# Patient Record
Sex: Male | Born: 1995 | Race: White | Hispanic: No | Marital: Single | Smoking: Never smoker
Health system: Southern US, Community
[De-identification: ages and names within clinical notes are randomized; demographics above are authoritative.]

## PROBLEM LIST (undated history)

## (undated) DIAGNOSIS — S060XAA Concussion with loss of consciousness status unknown, initial encounter: Secondary | ICD-10-CM

## (undated) DIAGNOSIS — S060X9A Concussion with loss of consciousness of unspecified duration, initial encounter: Secondary | ICD-10-CM

## (undated) DIAGNOSIS — R519 Headache, unspecified: Secondary | ICD-10-CM

## (undated) DIAGNOSIS — J45909 Unspecified asthma, uncomplicated: Secondary | ICD-10-CM

## (undated) DIAGNOSIS — M6282 Rhabdomyolysis: Secondary | ICD-10-CM

## (undated) DIAGNOSIS — G43909 Migraine, unspecified, not intractable, without status migrainosus: Secondary | ICD-10-CM

## (undated) DIAGNOSIS — R51 Headache: Secondary | ICD-10-CM

## (undated) HISTORY — PX: NO PAST SURGERIES: SHX2092

---
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
## 2017-07-01 ENCOUNTER — Encounter (HOSPITAL_COMMUNITY): Payer: Self-pay | Admitting: Emergency Medicine

## 2017-07-01 ENCOUNTER — Inpatient Hospital Stay (HOSPITAL_COMMUNITY)
Admission: EM | Admit: 2017-07-01 | Discharge: 2017-07-07 | DRG: 558 | Disposition: A | Discharge status: Home / Self Care | Payer: 59 | Attending: Internal Medicine | Admitting: Internal Medicine

## 2017-07-01 ENCOUNTER — Other Ambulatory Visit: Payer: Self-pay

## 2017-07-01 DIAGNOSIS — M6282 Rhabdomyolysis: Secondary | ICD-10-CM | POA: Diagnosis not present

## 2017-07-01 DIAGNOSIS — R7401 Elevation of levels of liver transaminase levels: Secondary | ICD-10-CM

## 2017-07-01 DIAGNOSIS — R748 Abnormal levels of other serum enzymes: Secondary | ICD-10-CM | POA: Diagnosis present

## 2017-07-01 DIAGNOSIS — Z79899 Other long term (current) drug therapy: Secondary | ICD-10-CM

## 2017-07-01 DIAGNOSIS — R740 Nonspecific elevation of levels of transaminase and lactic acid dehydrogenase [LDH]: Secondary | ICD-10-CM

## 2017-07-01 HISTORY — DX: Rhabdomyolysis: M62.82

## 2017-07-01 HISTORY — DX: Unspecified asthma, uncomplicated: J45.909

## 2017-07-01 HISTORY — DX: Headache: R51

## 2017-07-01 HISTORY — DX: Headache, unspecified: R51.9

## 2017-07-01 HISTORY — DX: Migraine, unspecified, not intractable, without status migrainosus: G43.909

## 2017-07-01 HISTORY — DX: Concussion with loss of consciousness of unspecified duration, initial encounter: S06.0X9A

## 2017-07-01 HISTORY — DX: Concussion with loss of consciousness status unknown, initial encounter: S06.0XAA

## 2017-07-01 LAB — CBC WITH DIFFERENTIAL/PLATELET
BASOS PCT: 0 %
Basophils Absolute: 0 10*3/uL (ref 0.0–0.1)
EOS ABS: 0.6 10*3/uL (ref 0.0–0.7)
Eosinophils Relative: 5 %
HEMATOCRIT: 44.2 % (ref 39.0–52.0)
Hemoglobin: 15.4 g/dL (ref 13.0–17.0)
Lymphocytes Relative: 28 %
Lymphs Abs: 3.4 10*3/uL (ref 0.7–4.0)
MCH: 30.1 pg (ref 26.0–34.0)
MCHC: 34.8 g/dL (ref 30.0–36.0)
MCV: 86.3 fL (ref 78.0–100.0)
MONO ABS: 0.9 10*3/uL (ref 0.1–1.0)
MONOS PCT: 7 %
NEUTROS ABS: 7.4 10*3/uL (ref 1.7–7.7)
Neutrophils Relative %: 60 %
Platelets: 374 10*3/uL (ref 150–400)
RBC: 5.12 MIL/uL (ref 4.22–5.81)
RDW: 12.5 % (ref 11.5–15.5)
WBC: 12.2 10*3/uL — ABNORMAL HIGH (ref 4.0–10.5)

## 2017-07-01 LAB — BASIC METABOLIC PANEL
Anion gap: 9 (ref 5–15)
BUN: 15 mg/dL (ref 6–20)
CALCIUM: 9.6 mg/dL (ref 8.9–10.3)
CO2: 28 mmol/L (ref 22–32)
CREATININE: 1.17 mg/dL (ref 0.61–1.24)
Chloride: 104 mmol/L (ref 101–111)
GFR calc non Af Amer: >60 mL/min (ref >60)
Glucose, Bld: 91 mg/dL (ref 65–99)
Potassium: 3.6 mmol/L (ref 3.5–5.1)
Sodium: 141 mmol/L (ref 135–145)

## 2017-07-01 LAB — CK: Total CK: 43564 U/L — ABNORMAL HIGH (ref 49–397)

## 2017-07-01 MED ORDER — SODIUM CHLORIDE 0.9 % IV BOLUS
3000.0000 mL | Freq: Once | INTRAVENOUS | Status: AC
  Administered 2017-07-01: 3000 mL via INTRAVENOUS

## 2017-07-01 NOTE — ED Triage Notes (Signed)
Pt reports he had not worked out in 2 years. Started working out a week ago and has pain to bilateral thighs and muscle cramps to bilateral legs. Pain 5/10.

## 2017-07-01 NOTE — ED Provider Notes (Signed)
[facility] EMERGENCY DEPARTMENT Provider Note   CSN: [id number] Arrival date & time: 07/01/17  1733     History   Chief Complaint Chief Complaint  Patient presents with  . Leg Pain    bilateral    HPI Benjamin Mathews is a 22 y.o. male.  HPI  This is a 22 year old male with no significant past medical history who presents with leg pain.  Patient reports that he worked out for the first time in 2 years last week.  He states that it was a strenuous workout and included lots of leg work.  Following that he thought he was "sore."  However he had progressive worsening soreness and pain over the bilateral thighs.  He was seen in urgent care and told he strained some muscles.  He was given ibuprofen and a muscle relaxer.  He did not have much improvement.  He returned to urgent care today and was referred here for further evaluation.  States that he does not have much pain when he is sitting but has much more pain he walks.  Current pain is 2 out of 10 while sitting.  Denies decreased urination.  Denies chest pain, shortness of breath, fevers.  Past Medical History:  Diagnosis Date  . Asthma     There are no active problems to display for this patient.   No past surgical history on file.      Home Medications    Prior to Admission medications   Medication Sig Start Date End Date Taking? Authorizing Provider  albuterol (PROVENTIL HFA;VENTOLIN HFA) 108 (90 BASE) MCG/ACT inhaler Inhale 2 puffs into the lungs every 6 (six) hours as needed. For wheezing     [provider]  ibuprofen (ADVIL,MOTRIN) 600 MG tablet Take 1 tablet (600 mg total) by mouth every 6 (six) hours as needed for mild pain. 05/15/14   Marcellina Millin, MD  ondansetron (ZOFRAN-ODT) 4 MG disintegrating tablet Take 1 tablet (4 mg total) by mouth every 8 (eight) hours as needed for nausea or vomiting. 05/15/14   Marcellina Millin, MD    Family History No family history on file.  Social  History Social History   Tobacco Use  . Smoking status: Never Smoker  Substance Use Topics  . Alcohol use: No  . Drug use: No     Allergies   Patient has no known allergies.   Review of Systems Review of Systems  Constitutional: Negative for fever.  Respiratory: Negative for shortness of breath.   Cardiovascular: Negative for chest pain.  Gastrointestinal: Negative for abdominal pain, nausea and vomiting.  Genitourinary: Negative for difficulty urinating.  Musculoskeletal:       Bilateral thigh pain  All other systems reviewed and are negative.    Physical Exam Updated Vital Signs BP (!) 144/83   Pulse 92   Temp 98.3 F (36.8 C)   Resp 13   Ht  (1.651 m)   Wt 70.3 kg (155 lb)   SpO2 98%   BMI 25.79 kg/m   Physical Exam  Constitutional: He is oriented to person, place, and time. He appears well-developed and well-nourished. No distress.  HENT:  Head: Normocephalic and atraumatic.  Eyes: Pupils are equal, round, and reactive to light.  Cardiovascular: Normal rate, regular rhythm and normal heart sounds.  No murmur heard. Pulmonary/Chest: Effort normal and breath sounds normal. No respiratory distress. He has no wheezes.  Abdominal: Soft. Bowel sounds are normal. There is no tenderness. There is no  rebound.  Musculoskeletal: He exhibits no edema.  There is palpation bilateral thighs, no overlying skin changes  Neurological: He is alert and oriented to person, place, and time.  Skin: Skin is warm and dry.  Psychiatric: He has a normal mood and affect.  Nursing note and vitals reviewed.    ED Treatments / Results  Labs (all labs ordered are listed, but only abnormal results are displayed) Labs Reviewed  CBC WITH DIFFERENTIAL/PLATELET - Abnormal; Notable for the following components:      Result Value   WBC 12.2 (*)    All other components within normal limits  CK - Abnormal; Notable for the following components:   Total CK 43,564 (*)    All other  components within normal limits  BASIC METABOLIC PANEL  URINALYSIS, ROUTINE W REFLEX MICROSCOPIC    EKG None  Radiology No results found.  Procedures Procedures (including critical care time)  Medications Ordered in ED Medications  sodium chloride 0.9 % bolus 3,000 mL (3,000 mLs Intravenous New Bag/Given 07/01/17 2356)     Initial Impression / Assessment and Plan / ED Course  I have reviewed the triage vital signs and the nursing notes.  Pertinent labs & imaging results that were available during my care of the patient were reviewed by me and considered in my medical decision making (see chart for details).     Presents with bilateral thigh pain.  Ongoing since working out last week.  Has also been taking ibuprofen.  His vital signs are reassuring and his exam is fairly benign.  However, CK is 43,000.  Creatinine is 1.17.  No baseline.  3 L of fluid were ordered.  Given extent of elevated CK, patient will need admission to ensure no subsequent renal dysfunction and downtrending of these numbers.  Suspect rhabdomyolysis is the cause of the patient's pain.  Final Clinical Impressions(s) / ED Diagnoses   Final diagnoses:  Non-traumatic rhabdomyolysis    ED Discharge Orders    None       Horton, Mayer Masker, MD 07/02/17 [phone]

## 2017-07-01 NOTE — ED Provider Notes (Signed)
Patient placed in Quick Look pathway, seen and evaluated   Chief Complaint: bilaterl leg pain  HPI:   PT here with bilateral thigh pain. States hasn't worked out in two [employer] and worked out first time 5 days ago. States did legs. Pain started same day at the end of workout. Went to UC, initially told it may be a sprain/strain. Took muscle relaxant and naprosyn and its not helping. Went back to UC today and sent here. Pain worse with movement. Reports spasms as well.   ROS: pain in bilateral thiughs. No numbness or weakness  Physical Exam:   Gen: No distress  Neuro: Awake and Alert  Skin: Warm    Focused Exam: pt in NAD. No obvious swelling or deformity in bilateral thighs or LE. TTP over bilateral anterior thighs. Pain with knee flexion bilaterally. DP pulses intact and equal.    Initiation of care has begun. The patient has been counseled on the process, plan, and necessity for staying for the completion/evaluation, and the remainder of the medical screening examination   Pt with bilateral quadricept pain after a strenuous workout 5 days ago. Differential includes strain, spasms, dehydration, rhabdomyolisis. Will get labs and   Vitals:   07/01/17 1822  BP: (!) 148/97  Pulse: (!) 109  Resp: 18  Temp: 98.3 F (36.8 C)  SpO2: 100%      Jaynie Crumble, PA-C 07/01/17 1956    Eber Hong, MD 07/03/17 [phone]

## 2017-07-02 DIAGNOSIS — R740 Nonspecific elevation of levels of transaminase and lactic acid dehydrogenase [LDH]: Secondary | ICD-10-CM | POA: Diagnosis not present

## 2017-07-02 DIAGNOSIS — M6282 Rhabdomyolysis: Secondary | ICD-10-CM | POA: Diagnosis not present

## 2017-07-02 DIAGNOSIS — Z79899 Other long term (current) drug therapy: Secondary | ICD-10-CM | POA: Diagnosis not present

## 2017-07-02 DIAGNOSIS — R748 Abnormal levels of other serum enzymes: Secondary | ICD-10-CM | POA: Diagnosis present

## 2017-07-02 LAB — URIC ACID: Uric Acid, Serum: 6 mg/dL (ref 4.4–7.6)

## 2017-07-02 LAB — URINALYSIS, ROUTINE W REFLEX MICROSCOPIC
BILIRUBIN URINE: NEGATIVE
Bacteria, UA: NONE SEEN
Glucose, UA: NEGATIVE mg/dL
Ketones, ur: NEGATIVE mg/dL
Leukocytes, UA: NEGATIVE
Nitrite: NEGATIVE
PROTEIN: NEGATIVE mg/dL
SPECIFIC GRAVITY, URINE: 1.013 (ref 1.005–1.030)
pH: 6 (ref 5.0–8.0)

## 2017-07-02 LAB — HIV ANTIBODY (ROUTINE TESTING W REFLEX): HIV Screen 4th Generation wRfx: NONREACTIVE

## 2017-07-02 LAB — PHOSPHORUS: Phosphorus: 3.4 mg/dL (ref 2.5–4.6)

## 2017-07-02 LAB — BASIC METABOLIC PANEL
Anion gap: 7 (ref 5–15)
BUN: 13 mg/dL (ref 6–20)
CALCIUM: 8.6 mg/dL — AB (ref 8.9–10.3)
CO2: 26 mmol/L (ref 22–32)
CREATININE: 1.03 mg/dL (ref 0.61–1.24)
Chloride: 110 mmol/L (ref 101–111)
GFR calc Af Amer: >60 mL/min (ref >60)
GFR calc non Af Amer: >60 mL/min (ref >60)
Glucose, Bld: 87 mg/dL (ref 65–99)
Potassium: 4.5 mmol/L (ref 3.5–5.1)
Sodium: 143 mmol/L (ref 135–145)

## 2017-07-02 LAB — HEPATIC FUNCTION PANEL
ALK PHOS: 51 U/L (ref 38–126)
ALT: 195 U/L — AB (ref 17–63)
AST: 659 U/L — ABNORMAL HIGH (ref 15–41)
Albumin: 3.8 g/dL (ref 3.5–5.0)
BILIRUBIN INDIRECT: 0.9 mg/dL (ref 0.3–0.9)
Bilirubin, Direct: 0.1 mg/dL (ref 0.1–0.5)
TOTAL PROTEIN: 5.8 g/dL — AB (ref 6.5–8.1)
Total Bilirubin: 1 mg/dL (ref 0.3–1.2)

## 2017-07-02 LAB — CK: Total CK: >50000 U/L — ABNORMAL HIGH (ref 49–397)

## 2017-07-02 MED ORDER — BISACODYL 5 MG PO TBEC: 5.0000 mg | DELAYED_RELEASE_TABLET | Freq: Every day | ORAL | Status: DC | PRN

## 2017-07-02 MED ORDER — SODIUM CHLORIDE 0.9 % IV SOLN
INTRAVENOUS | Status: AC
  Administered 2017-07-02: 04:00:00 via INTRAVENOUS

## 2017-07-02 MED ORDER — SODIUM CHLORIDE 0.9 % IV SOLN
INTRAVENOUS | Status: DC
  Administered 2017-07-02 – 2017-07-07 (×12): via INTRAVENOUS

## 2017-07-02 MED ORDER — ENOXAPARIN SODIUM 40 MG/0.4ML ~~LOC~~ SOLN
40.0000 mg | SUBCUTANEOUS | Status: DC
Start: 2017-07-02 — End: 2017-07-07
  Administered 2017-07-02: 40 mg via SUBCUTANEOUS
  Filled 2017-07-02 (×3): qty 0.4

## 2017-07-02 MED ORDER — HYDROCODONE-ACETAMINOPHEN 5-325 MG PO TABS
1.0000 | ORAL_TABLET | ORAL | Status: DC | PRN
  Administered 2017-07-02: 2 via ORAL
  Administered 2017-07-02 – 2017-07-06 (×8): 1 via ORAL
  Filled 2017-07-02: qty 1
  Filled 2017-07-02: qty 2
  Filled 2017-07-02 (×2): qty 1
  Filled 2017-07-02: qty 2
  Filled 2017-07-02 (×4): qty 1

## 2017-07-02 MED ORDER — CYCLOBENZAPRINE HCL 10 MG PO TABS
10.0000 mg | ORAL_TABLET | Freq: Three times a day (TID) | ORAL | Status: DC | PRN
  Administered 2017-07-02 (×2): 10 mg via ORAL
  Filled 2017-07-02 (×2): qty 1

## 2017-07-02 MED ORDER — SENNOSIDES-DOCUSATE SODIUM 8.6-50 MG PO TABS
1.0000 | ORAL_TABLET | Freq: Every evening | ORAL | Status: DC | PRN
  Administered 2017-07-02 – 2017-07-03 (×2): 1 via ORAL
  Filled 2017-07-02 (×2): qty 1

## 2017-07-02 MED ORDER — ONDANSETRON HCL 4 MG/2ML IJ SOLN: 4.0000 mg | Freq: Four times a day (QID) | INTRAMUSCULAR | Status: DC | PRN

## 2017-07-02 MED ORDER — ACETAMINOPHEN 650 MG RE SUPP: 650.0000 mg | Freq: Four times a day (QID) | RECTAL | Status: DC | PRN

## 2017-07-02 MED ORDER — ACETAMINOPHEN 325 MG PO TABS: 650.0000 mg | ORAL_TABLET | Freq: Four times a day (QID) | ORAL | Status: DC | PRN

## 2017-07-02 MED ORDER — ONDANSETRON HCL 4 MG PO TABS: 4.0000 mg | ORAL_TABLET | Freq: Four times a day (QID) | ORAL | Status: DC | PRN

## 2017-07-02 MED ORDER — MORPHINE SULFATE (PF) 4 MG/ML IV SOLN: 1.0000 mg | INTRAVENOUS | Status: DC | PRN

## 2017-07-02 MED ORDER — OXYCODONE-ACETAMINOPHEN 5-325 MG PO TABS
1.0000 | ORAL_TABLET | Freq: Once | ORAL | Status: AC
  Administered 2017-07-02: 1 via ORAL
  Filled 2017-07-02: qty 1

## 2017-07-02 NOTE — Progress Notes (Signed)
Patient is admitted early this am for rhabdomyolysis. He is seen and examined, vital signs are stable, compartment soft. Continue hydration, trend ck.

## 2017-07-02 NOTE — H&P (Signed)
History and Physical    Benjamin Mathews ZOX:[id number] DOB: 1995/07/23 DOA: 07/01/2017  PCP: Dossie Arbour, MD   Patient coming from: Home  Chief Complaint: Proximal leg aches bilaterally   HPI: Benjamin Mathews is a 22 y.o. male with medical history significant for childhood asthma, now presenting to the emergency department for evaluation of bilateral thigh aches.  Patient reports that he usually enjoys good health and was in his usual state, was exercising and lifting weights on 06/27/2017, and went on to develop severe ache in the bilateral thighs.  He had not exercise like this in 2 years prior to the workout on 06/27/2017.  Patient denies any chest pain or palpitations and denies abdominal or flank pain.  He was seen at urgent care for these complaints couple days ago and treated with Flexeril and Naprosyn.  Symptoms continue to progress despite this, prompting him to return.   ED Course: Upon arrival to the ED, patient is found to be afebrile, saturating well on room air, slightly tachycardic, and with stable blood pressure.  Basic metabolic panel is normal, CBC features a mild leukocytosis, urinalysis is notable for "large hemoglobin" but no RBC.  Serum CK is elevated to 43,564.  Patient was treated with Percocet and 3 L of normal saline.  He will be admitted for ongoing evaluation and management of rhabdomyolysis.  Review of Systems:  All other systems reviewed and apart from HPI, are negative.  Past Medical History:  Diagnosis Date  . Asthma     No past surgical history on file.   reports that he has never smoked. He does not have any smokeless tobacco history on file. He reports that he does not drink alcohol or use drugs.  No Known Allergies  No family history on file.   Prior to Admission medications   Medication Sig Start Date End Date Taking? Authorizing Provider  cyclobenzaprine (FLEXERIL) 10 MG tablet Take 10 mg by mouth 3 (three) times daily as needed for muscle  spasms.   Yes [provider]  naproxen (NAPROSYN) 500 MG tablet Take 500 mg by mouth 2 (two) times daily as needed for mild pain.   Yes [provider]  ibuprofen (ADVIL,MOTRIN) 600 MG tablet Take 1 tablet (600 mg total) by mouth every 6 (six) hours as needed for mild pain. Patient not taking: Reported on 07/02/2017 05/15/14   Marcellina Millin, MD  ondansetron (ZOFRAN-ODT) 4 MG disintegrating tablet Take 1 tablet (4 mg total) by mouth every 8 (eight) hours as needed for nausea or vomiting. Patient not taking: Reported on 07/02/2017 05/15/14   Marcellina Millin, MD    Physical Exam: Vitals:   07/02/17 0200 07/02/17 0230 07/02/17 0300 07/02/17 0330  BP: 129/83 135/77 121/77 119/68  Pulse: 85 87 87 85  Resp: Temp:      SpO2: 100% 100% 98% 98%  Weight:      Height:          Constitutional: NAD, calm  Eyes: PERTLA, lids and conjunctivae normal ENMT: Mucous membranes are moist. Posterior pharynx clear of any exudate or lesions.   Neck: normal, supple, no masses, no thyromegaly Respiratory: clear to auscultation bilaterally, no wheezing, no crackles. Normal respiratory effort.  Cardiovascular: S1 & S2 heard, regular rate and rhythm. No significant JVD. Abdomen: No distension, no tenderness, soft. Bowel sounds normal.  Musculoskeletal: no clubbing / cyanosis. No joint deformity upper and lower extremities. Proximal legs tender bilaterally, but soft.  Skin: no  significant rashes, lesions, ulcers. Warm, dry, well-perfused. Neurologic: CN 2-12 grossly intact. Sensation intact. Strength 5/5 in all 4 limbs.  Psychiatric: Alert and oriented x 3. Calm, cooperative.     Labs on Admission: I have personally reviewed following labs and imaging studies  CBC: Recent Labs  Lab 07/01/17 1842  WBC 12.2*  NEUTROABS 7.4  HGB 15.4  HCT 44.2  MCV 86.3  PLT 374   Basic Metabolic Panel: Recent Labs  Lab 07/01/17 1842  NA 141  K 3.6  CL 104  CO2 28  GLUCOSE 91  BUN 15   CREATININE 1.17  CALCIUM 9.6   GFR: Estimated Creatinine Clearance: 87.6 mL/min (by C-G formula based on SCr of 1.17 mg/dL). Liver Function Tests: No results for input(s): AST, ALT, ALKPHOS, BILITOT, PROT, ALBUMIN in the last 168 hours. No results for input(s): LIPASE, AMYLASE in the last 168 hours. No results for input(s): AMMONIA in the last 168 hours. Coagulation Profile: No results for input(s): INR, PROTIME in the last 168 hours. Cardiac Enzymes: Recent Labs  Lab 07/01/17 1842  CKTOTAL 43,564*   BNP (last 3 results) No results for input(s): PROBNP in the last 8760 hours. HbA1C: No results for input(s): HGBA1C in the last 72 hours. CBG: No results for input(s): GLUCAP in the last 168 hours. Lipid Profile: No results for input(s): CHOL, HDL, LDLCALC, TRIG, CHOLHDL, LDLDIRECT in the last 72 hours. Thyroid Function Tests: No results for input(s): TSH, T4TOTAL, FREET4, T3FREE, THYROIDAB in the last 72 hours. Anemia Panel: No results for input(s): VITAMINB12, FOLATE, FERRITIN, TIBC, IRON, RETICCTPCT in the last 72 hours. Urine analysis:    Component Value Date/Time   COLORURINE YELLOW 07/02/2017 0101   APPEARANCEUR CLEAR 07/02/2017 0101   LABSPEC 1.013 07/02/2017 0101   PHURINE 6.0 07/02/2017 0101   GLUCOSEU NEGATIVE 07/02/2017 0101   HGBUR LARGE (A) 07/02/2017 0101   BILIRUBINUR NEGATIVE 07/02/2017 0101   KETONESUR NEGATIVE 07/02/2017 0101   PROTEINUR NEGATIVE 07/02/2017 0101   NITRITE NEGATIVE 07/02/2017 0101   LEUKOCYTESUR NEGATIVE 07/02/2017 0101   Sepsis Labs: (procalcitonin:4,lacticidven:4) )No results found for this or any previous visit (from the past 240 hour(s)).   Radiological Exams on Admission: No results found.  EKG: Ordered and pending.   Assessment/Plan   1. Rhabdomyolysis  - Presents with bilateral thigh ache after unusually heavy exertion on 06/27/17  - Serum CK elevated to 43,564  - SCr is 1.17 with no prior for comparison; UA with  "Large Hgb" but no RBC's - Treated in ED with 3 liters NS and analgesia  - Proximal legs tender but soft  - Continue IVF hydration, check uric acid, phosphorus, and EKG, and trend CK     DVT prophylaxis: Lovenox Code Status: Full  Family Communication: Discussed with patient  Consults called: None Admission status: Observation    Briscoe Deutscher, MD [facility] Pager [phone]  If 7PM-7AM, please contact night-coverage [email or web address] Password [email or web address]  07/02/2017, 3:51 AM

## 2017-07-03 LAB — BASIC METABOLIC PANEL
ANION GAP: 11 (ref 5–15)
BUN: 10 mg/dL (ref 6–20)
CALCIUM: 9.2 mg/dL (ref 8.9–10.3)
CO2: 25 mmol/L (ref 22–32)
Chloride: 106 mmol/L (ref 101–111)
Creatinine, Ser: 1.13 mg/dL (ref 0.61–1.24)
GLUCOSE: 80 mg/dL (ref 65–99)
POTASSIUM: 4.3 mmol/L (ref 3.5–5.1)
Sodium: 142 mmol/L (ref 135–145)

## 2017-07-03 LAB — HEPATIC FUNCTION PANEL
ALK PHOS: 48 U/L (ref 38–126)
ALT: 196 U/L — AB (ref 17–63)
AST: 563 U/L — AB (ref 15–41)
Albumin: 3.7 g/dL (ref 3.5–5.0)
BILIRUBIN TOTAL: 0.6 mg/dL (ref 0.3–1.2)
Bilirubin, Direct: 0.2 mg/dL (ref 0.1–0.5)
Indirect Bilirubin: 0.4 mg/dL (ref 0.3–0.9)
Total Protein: 5.9 g/dL — ABNORMAL LOW (ref 6.5–8.1)

## 2017-07-03 LAB — CK: CK TOTAL: 36339 U/L — AB (ref 49–397)

## 2017-07-03 NOTE — Progress Notes (Signed)
[facility] PROGRESS NOTE  Benjamin Mathews ZOX:[id number] DOB: 08-16-1995 DOA: 07/01/2017 PCP: Dossie Arbour, MD  Assessment/Plan:  Rhabdomyolysis  - Presents with bilateral thigh ache after unusually heavy exertion on 06/27/17  - Serum CK elevated to 43k -->50k-->36k - Cr stable - UA with "Large Hgb" but no RBC's - Continue IVF hydration,  trend CK     DVT prophylaxis: Lovenox Code Status: Full  Family Communication: Discussed with patient  Consults called: None Admission status: Observation    Consultants: N/a  HPI/Subjective: Leg pain better. No fever. Urinating. No complaints.  Objective: Vitals:   07/02/17 2240 07/03/17 0548  BP: 131/75 130/78  Pulse: 78 91  Resp: 18 18  Temp: 97.7 F (36.5 C) (!) 97.4 F (36.3 C)  SpO2: 98% 100%    Intake/Output Summary (Last 24 hours) at 07/03/2017 1206 Last data filed at 07/03/2017 8119 Gross per 24 hour  Intake 2459.99 ml  Output -  Net 2459.99 ml   Filed Weights   07/01/17 2346 07/02/17 0818  Weight: 70.3 kg (155 lb) 72.8 kg (160 lb 7.9 oz)    Exam:  General:  No diaphoresis, anxious, no acute distress Cardiovascular: Regular rate and rhythm no murmurs rubs or gallops Respiratory: Clear to auscultation bilaterally no more breathing Abdomen: Nondistended bowel sounds normal nontender palpation Musculoskeletal: Moving all extremities, no deformity, 5 out of 5 strength    Data Reviewed: Basic Metabolic Panel: Recent Labs  Lab 07/01/17 1842 07/02/17 0403 07/03/17 0434  NA 141 143 142  K 3.6 4.5 4.3  CL 104 110 106  CO2 GLUCOSE 91 87 80  BUN CREATININE 1.17 1.03 1.13  CALCIUM 9.6 8.6* 9.2  PHOS  --  3.4  --    Liver Function Tests: Recent Labs  Lab 07/02/17 0403  AST 659*  ALT 195*  ALKPHOS 51  BILITOT 1.0  PROT 5.8*  ALBUMIN 3.8   No results for input(s): LIPASE, AMYLASE in the last 168 hours. No results for input(s): AMMONIA in the last 168 hours. CBC: Recent  Labs  Lab 07/01/17 1842  WBC 12.2*  NEUTROABS 7.4  HGB 15.4  HCT 44.2  MCV 86.3  PLT 374   Cardiac Enzymes: Recent Labs  Lab 07/01/17 1842 07/02/17 0403 07/03/17 0434  CKTOTAL 43,564* >50,000* 36,339*   BNP (last 3 results) No results for input(s): BNP in the last 8760 hours.  ProBNP (last 3 results) No results for input(s): PROBNP in the last 8760 hours.  CBG: No results for input(s): GLUCAP in the last 168 hours.  No results found for this or any previous visit (from the past 240 hour(s)).   Studies: No results found.  Scheduled Meds: . enoxaparin (LOVENOX) injection  40 mg Subcutaneous Q24H   Continuous Infusions: . sodium chloride 100 mL/hr at 07/02/17 2327    Active Problems:   Rhabdomyolysis    Time spent: 30    Haydee Salter  [facility] Pager AMION. If 7PM-7AM, please contact night-coverage at [email or web address], password [facility] 07/03/2017, 12:06 PM  LOS: 1 day

## 2017-07-04 ENCOUNTER — Encounter (HOSPITAL_COMMUNITY): Payer: Self-pay | Admitting: General Practice

## 2017-07-04 LAB — COMPREHENSIVE METABOLIC PANEL
ALK PHOS: 50 U/L (ref 38–126)
ALT: 220 U/L — AB (ref 17–63)
AST: 565 U/L — AB (ref 15–41)
Albumin: 3.9 g/dL (ref 3.5–5.0)
Anion gap: 8 (ref 5–15)
BUN: 10 mg/dL (ref 6–20)
CALCIUM: 9.5 mg/dL (ref 8.9–10.3)
CHLORIDE: 103 mmol/L (ref 101–111)
CO2: 31 mmol/L (ref 22–32)
CREATININE: 1.05 mg/dL (ref 0.61–1.24)
Glucose, Bld: 89 mg/dL (ref 65–99)
Potassium: 4 mmol/L (ref 3.5–5.1)
Sodium: 142 mmol/L (ref 135–145)
Total Bilirubin: 0.7 mg/dL (ref 0.3–1.2)
Total Protein: 6.4 g/dL — ABNORMAL LOW (ref 6.5–8.1)

## 2017-07-04 LAB — CK
CK TOTAL: 31660 U/L — AB (ref 49–397)
CK TOTAL: 4381 U/L — AB (ref 49–397)

## 2017-07-04 LAB — PHOSPHORUS: PHOSPHORUS: 3.4 mg/dL (ref 2.5–4.6)

## 2017-07-04 NOTE — Progress Notes (Signed)
[facility] PROGRESS NOTE  Benjamin Mathews UJW:[id number] DOB: 12-23-1995 DOA: 07/01/2017 PCP: Patient, No Pcp Per  Assessment/Plan:  Rhabdomyolysis  - Presents with bilateral thigh ache after unusually heavy exertion on 06/27/17  - Serum CK elevated to 43k -->50k-->36k-->31k, will trend - Cr stable - Continue IVF hydration, rate increased  DVT prophylaxis: Lovenox Code Status: Full  Family Communication: Discussed with patient  Consults called: None Admission status: Observation    Consultants: N/a  HPI/Subjective: Leg pain better again today. No fever. Urinating well. No complaints.  Objective: Vitals:   07/04/17 0538 07/04/17 1458  BP: 126/73 140/79  Pulse: 97 94  Resp: 18   Temp: 97.9 F (36.6 C) 98.4 F (36.9 C)  SpO2: 100% 99%    Intake/Output Summary (Last 24 hours) at 07/04/2017 1627 Last data filed at 07/04/2017 1500 Gross per 24 hour  Intake 3553.33 ml  Output -  Net 3553.33 ml   Filed Weights   07/01/17 2346 07/02/17 0818  Weight: 70.3 kg (155 lb) 72.8 kg (160 lb 7.9 oz)    Exam:  General:  No diaphoresis, anxious, NAD Cardiovascular: Regular rate and rhythm no murmurs rubs or gallops Respiratory: Clear to auscultation bilaterally no more breathing Abdomen: Nondistended bowel sounds normal nontender palpation Musculoskeletal: Moving all extremities, no deformity, 5 out of 5 strength    Data Reviewed: Basic Metabolic Panel: Recent Labs  Lab 07/01/17 1842 07/02/17 0403 07/03/17 0434 07/04/17 0735  NA 141 143 142 142  K 3.6 4.5 4.3 4.0  CL 104 110 106 103  CO2 GLUCOSE 91 87 80 89  BUN CREATININE 1.17 1.03 1.13 1.05  CALCIUM 9.6 8.6* 9.2 9.5  PHOS  --  3.4  --  3.4   Liver Function Tests: Recent Labs  Lab 07/02/17 0403 07/03/17 0955 07/04/17 0735  AST 659* 563* 565*  ALT 195* 196* 220*  ALKPHOS 51 48 50  BILITOT 1.0 0.6 0.7  PROT 5.8* 5.9* 6.4*  ALBUMIN 3.8 3.7 3.9   No results for input(s):  LIPASE, AMYLASE in the last 168 hours. No results for input(s): AMMONIA in the last 168 hours. CBC: Recent Labs  Lab 07/01/17 1842  WBC 12.2*  NEUTROABS 7.4  HGB 15.4  HCT 44.2  MCV 86.3  PLT 374   Cardiac Enzymes: Recent Labs  Lab 07/01/17 1842 07/02/17 0403 07/03/17 0434 07/04/17 0735  CKTOTAL 43,564* >50,000* 36,339* 31,660*   BNP (last 3 results) No results for input(s): BNP in the last 8760 hours.  ProBNP (last 3 results) No results for input(s): PROBNP in the last 8760 hours.  CBG: No results for input(s): GLUCAP in the last 168 hours.  No results found for this or any previous visit (from the past 240 hour(s)).   Studies: No results found.  Scheduled Meds: . enoxaparin (LOVENOX) injection  40 mg Subcutaneous Q24H   Continuous Infusions: . sodium chloride 150 mL/hr at 07/04/17 1244    Active Problems:   Rhabdomyolysis    Time spent: 30    Haydee Salter  [facility] Pager AMION. If 7PM-7AM, please contact night-coverage at [email or web address], password [facility] 07/04/2017, 4:27 PM  LOS: 2 days

## 2017-07-05 DIAGNOSIS — R740 Nonspecific elevation of levels of transaminase and lactic acid dehydrogenase [LDH]: Secondary | ICD-10-CM

## 2017-07-05 LAB — COMPREHENSIVE METABOLIC PANEL
ALT: 300 U/L — ABNORMAL HIGH (ref 17–63)
ANION GAP: 7 (ref 5–15)
AST: 590 U/L — ABNORMAL HIGH (ref 15–41)
Albumin: 3.7 g/dL (ref 3.5–5.0)
Alkaline Phosphatase: 51 U/L (ref 38–126)
BUN: 10 mg/dL (ref 6–20)
CHLORIDE: 103 mmol/L (ref 101–111)
CO2: 31 mmol/L (ref 22–32)
CREATININE: 1.03 mg/dL (ref 0.61–1.24)
Calcium: 9.5 mg/dL (ref 8.9–10.3)
Glucose, Bld: 86 mg/dL (ref 65–99)
POTASSIUM: 3.7 mmol/L (ref 3.5–5.1)
SODIUM: 141 mmol/L (ref 135–145)
Total Bilirubin: 0.9 mg/dL (ref 0.3–1.2)
Total Protein: 6.1 g/dL — ABNORMAL LOW (ref 6.5–8.1)

## 2017-07-05 LAB — HEPATITIS PANEL, ACUTE
HCV AB: 0.2 {s_co_ratio} (ref 0.0–0.9)
HEP B C IGM: NEGATIVE
HEP B S AG: NEGATIVE
Hep A IgM: NEGATIVE

## 2017-07-05 LAB — PROTIME-INR
INR: 1.01
PROTHROMBIN TIME: 13.2 s (ref 11.4–15.2)

## 2017-07-05 LAB — CK: CK TOTAL: 22207 U/L — AB (ref 49–397)

## 2017-07-05 NOTE — Progress Notes (Signed)
[facility] PROGRESS NOTE  [place] ZHY:[id number] DOB: [birth year] DOA: 07/01/2017 PCP: Patient, No Pcp Per  Assessment/Plan:  Rhabdomyolysis  - Presents with bilateral thigh ache after unusually heavy exertion on 06/27/17  - Serum CK elevated to 43k -->50k-->36k-->31k->22k, will trend - Cr stable - Continue IVF hydration  Elevated LFTS Acute hep panel neg RUQ pending  DVT prophylaxis: Lovenox Code Status: Full  Family Communication: Discussed with patient  Consults called: None Admission status: Observation    Consultants: N/a  HPI/Subjective: Leg pain better  today. No fever. Urinating well. No complaints. No RUQ pain.  Objective: Vitals:   07/05/17 0529 07/05/17 1212  BP: 136/84 131/79  Pulse: 87 94  Resp:  18  Temp: 98 F (36.7 C) 98.4 F (36.9 C)  SpO2: 99% 100%    Intake/Output Summary (Last 24 hours) at 07/05/2017 1528 Last data filed at 07/05/2017 0941 Gross per 24 hour  Intake 1856 ml  Output -  Net 1856 ml   Filed Weights   07/01/17 2346 07/02/17 0818  Weight: 70.3 kg (155 lb) 72.8 kg (160 lb 7.9 oz)    Exam:  General:  No diaphoresis, anxious, NAD Cardiovascular: Regular rate and rhythm no murmurs rubs or gallops Respiratory: Clear to auscultation bilaterally no more breathing Abdomen: Nondistended bowel sounds normal nontender palpation Musculoskeletal: Moving all extremities, no deformity, 5 out of 5 strength    Data Reviewed: Basic Metabolic Panel: Recent Labs  Lab 07/01/17 1842 07/02/17 0403 07/03/17 0434 07/04/17 0735 07/05/17 0534  NA 141 143 142 142 141  K 3.6 4.5 4.3 4.0 3.7  CL 104 110 106 103 103  CO2 GLUCOSE 91 87 80 89 86  BUN CREATININE 1.17 1.03 1.13 1.05 1.03  CALCIUM 9.6 8.6* 9.2 9.5 9.5  PHOS  --  3.4  --  3.4  --    Liver Function Tests: Recent Labs  Lab 07/02/17 0403 07/03/17 0955 07/04/17 0735 07/05/17 0534  AST 659* 563* 565* 590*  ALT 195* 196* 220* 300*   ALKPHOS 51 48 50 51  BILITOT 1.0 0.6 0.7 0.9  PROT 5.8* 5.9* 6.4* 6.1*  ALBUMIN 3.8 3.7 3.9 3.7   No results for input(s): LIPASE, AMYLASE in the last 168 hours. No results for input(s): AMMONIA in the last 168 hours. CBC: Recent Labs  Lab 07/01/17 1842  WBC 12.2*  NEUTROABS 7.4  HGB 15.4  HCT 44.2  MCV 86.3  PLT 374   Cardiac Enzymes: Recent Labs  Lab 07/02/17 0403 07/03/17 0434 07/04/17 0735 07/04/17 1656 07/05/17 0534  CKTOTAL >50,000* 29,528* 31,660* 4,381* 22,207*   BNP (last 3 results) No results for input(s): BNP in the last 8760 hours.  ProBNP (last 3 results) No results for input(s): PROBNP in the last 8760 hours.  CBG: No results for input(s): GLUCAP in the last 168 hours.  No results found for this or any previous visit (from the past 240 hour(s)).   Studies: No results found.  Scheduled Meds: . enoxaparin (LOVENOX) injection  40 mg Subcutaneous Q24H   Continuous Infusions: . sodium chloride 150 mL/hr at 07/05/17 1229    Active Problems:   Rhabdomyolysis    Time spent: 20    Haydee Salter  [facility] Pager AMION. If 7PM-7AM, please contact night-coverage at [email or web address], password [facility] 07/05/2017, 3:28 PM  LOS: 3 days

## 2017-07-06 ENCOUNTER — Inpatient Hospital Stay (HOSPITAL_COMMUNITY): Payer: 59

## 2017-07-06 LAB — COMPREHENSIVE METABOLIC PANEL
ALBUMIN: 3.7 g/dL (ref 3.5–5.0)
ALT: 371 U/L — ABNORMAL HIGH (ref 17–63)
AST: 545 U/L — ABNORMAL HIGH (ref 15–41)
Alkaline Phosphatase: 52 U/L (ref 38–126)
Anion gap: 10 (ref 5–15)
BILIRUBIN TOTAL: 0.7 mg/dL (ref 0.3–1.2)
BUN: 9 mg/dL (ref 6–20)
CHLORIDE: 104 mmol/L (ref 101–111)
CO2: 27 mmol/L (ref 22–32)
Calcium: 9.3 mg/dL (ref 8.9–10.3)
Creatinine, Ser: 0.96 mg/dL (ref 0.61–1.24)
GFR calc Af Amer: >60 mL/min (ref >60)
GFR calc non Af Amer: >60 mL/min (ref >60)
GLUCOSE: 89 mg/dL (ref 65–99)
POTASSIUM: 4 mmol/L (ref 3.5–5.1)
SODIUM: 141 mmol/L (ref 135–145)
TOTAL PROTEIN: 6.1 g/dL — AB (ref 6.5–8.1)

## 2017-07-06 LAB — CK: CK TOTAL: 17692 U/L — AB (ref 49–397)

## 2017-07-06 NOTE — Progress Notes (Signed)
PROGRESS NOTE  Benjamin Mathews NFA:[id number] DOB: Jan 23, 1996 DOA: 07/01/2017 PCP: Patient, No Pcp Per   LOS: 4 days   Brief Narrative / Interim history: 22 year old male with remote history of childhood asthma, currently healthy, who was admitted on 5/7 with bilateral thigh pains, was found to have rhabdomyolysis following gym workout  Assessment & Pln: Active Problems:   Rhabdomyolysis   Rhabdomyolysis -His CK is improving however still significantly elevated to 17,000, continue IV fluids -Renal function is normal  Elevated liver enzymes -Likely related to #1 -ALT has been slightly increasing in the last couple of days -Hep panel negative, right upper quadrant ultrasound pending   DVT prophylaxis: Lovenox Code Status: Full code Family Communication: no family at bedside Disposition Plan: home likely 1-2 days   Consultants:   None   Procedures:   None   Antimicrobials:  None    Subjective: - no chest pain, shortness of breath, no abdominal pain, nausea or vomiting.   Objective: Vitals:   07/05/17 2100 07/05/17 2121 07/06/17 0633 07/06/17 1418  BP: 134/66 (!) 138/93 (!) 108/58 132/83  Pulse: (!) 56 (!) 111 84 82  Resp: Temp: 98.4 F (36.9 C) 98.2 F (36.8 C) 97.9 F (36.6 C) 98.7 F (37.1 C)  TempSrc: Oral Oral Oral Oral  SpO2: 96% 96% 97% 100%  Weight:      Height:        Intake/Output Summary (Last 24 hours) at 07/06/2017 1435 Last data filed at 07/06/2017 0310 Gross per 24 hour  Intake 3325 ml  Output -  Net 3325 ml   Filed Weights   07/01/17 2346 07/02/17 0818  Weight: 70.3 kg (155 lb) 72.8 kg (160 lb 7.9 oz)    Examination:  Constitutional: NAD Eyes: lids and conjunctivae normal, no scleral icterus Respiratory: CTA biL, no wheezing, no crackles   Cardiovascular: Regular rate and rhythm, no murmurs / rubs / gallops. No LE edema. Abdomen: no tenderness. Bowel sounds positive.  Skin: no rashes seen Neurologic: non focal     Data Reviewed: I have independently reviewed following labs and imaging studies   CBC: Recent Labs  Lab 07/01/17 1842  WBC 12.2*  NEUTROABS 7.4  HGB 15.4  HCT 44.2  MCV 86.3  PLT 374   Basic Metabolic Panel: Recent Labs  Lab 07/02/17 0403 07/03/17 0434 07/04/17 0735 07/05/17 0534 07/06/17 0505  NA 143 142 142 141 141  K 4.5 4.3 4.0 3.7 4.0  CL 110 106 103 103 104  CO2 GLUCOSE 87 80 89 86 89  BUN CREATININE 1.03 1.13 1.05 1.03 0.96  CALCIUM 8.6* 9.2 9.5 9.5 9.3  PHOS 3.4  --  3.4  --   --    GFR: Estimated Creatinine Clearance: 106.8 mL/min (by C-G formula based on SCr of 0.96 mg/dL). Liver Function Tests: Recent Labs  Lab 07/02/17 0403 07/03/17 0955 07/04/17 0735 07/05/17 0534 07/06/17 0505  AST 659* 563* 565* 590* 545*  ALT 195* 196* 220* 300* 371*  ALKPHOS 51 48 50 51 52  BILITOT 1.0 0.6 0.7 0.9 0.7  PROT 5.8* 5.9* 6.4* 6.1* 6.1*  ALBUMIN 3.8 3.7 3.9 3.7 3.7   No results for input(s): LIPASE, AMYLASE in the last 168 hours. No results for input(s): AMMONIA in the last 168 hours. Coagulation Profile: Recent Labs  Lab 07/05/17 0534  INR 1.01   Cardiac Enzymes: Recent Labs  Lab 07/03/17 0434  07/04/17 0735 07/04/17 1656 07/05/17 0534 07/06/17 0505  CKTOTAL 16,109* 31,660* 4,381* 22,207* 17,692*   BNP (last 3 results) No results for input(s): PROBNP in the last 8760 hours. HbA1C: No results for input(s): HGBA1C in the last 72 hours. CBG: No results for input(s): GLUCAP in the last 168 hours. Lipid Profile: No results for input(s): CHOL, HDL, LDLCALC, TRIG, CHOLHDL, LDLDIRECT in the last 72 hours. Thyroid Function Tests: No results for input(s): TSH, T4TOTAL, FREET4, T3FREE, THYROIDAB in the last 72 hours. Anemia Panel: No results for input(s): VITAMINB12, FOLATE, FERRITIN, TIBC, IRON, RETICCTPCT in the last 72 hours. Urine analysis:    Component Value Date/Time   COLORURINE YELLOW 07/02/2017 0101    APPEARANCEUR CLEAR 07/02/2017 0101   LABSPEC 1.013 07/02/2017 0101   PHURINE 6.0 07/02/2017 0101   GLUCOSEU NEGATIVE 07/02/2017 0101   HGBUR LARGE (A) 07/02/2017 0101   BILIRUBINUR NEGATIVE 07/02/2017 0101   KETONESUR NEGATIVE 07/02/2017 0101   PROTEINUR NEGATIVE 07/02/2017 0101   NITRITE NEGATIVE 07/02/2017 0101   LEUKOCYTESUR NEGATIVE 07/02/2017 0101   Sepsis Labs: Invalid input(s): PROCALCITONIN, LACTICIDVEN  No results found for this or any previous visit (from the past 240 hour(s)).    Radiology Studies: US Abdomen Limited Ruq  Result Date: 07/06/2017 CLINICAL DATA:  22 year old male with abnormal LFTs. EXAM: ULTRASOUND ABDOMEN LIMITED RIGHT UPPER QUADRANT COMPARISON:  None. FINDINGS: Gallbladder: No gallstones or wall thickening visualized. No sonographic Murphy sign noted by sonographer. Common bile duct: Diameter: 2 millimeters, normal Liver: No intrahepatic biliary ductal dilatation is evident. No focal lesion identified. Within normal limits in parenchymal echogenicity. Portal vein is patent on color Doppler imaging with normal direction of blood flow towards the liver. Other findings: Negative visible right kidney.  No free fluid. IMPRESSION: Normal ultrasound appearance of the liver, gallbladder, and CBD. Electronically Signed   By: Odessa Fleming M.D.   On: 07/06/2017 14:29     Scheduled Meds: . enoxaparin (LOVENOX) injection  40 mg Subcutaneous Q24H   Continuous Infusions: . sodium chloride 150 mL/hr at 07/06/17 1327    Pamella Pert, MD, PhD [facility] Pager [phone]  If 7PM-7AM, please contact night-coverage [email or web address] Password [email or web address] 07/06/2017, 2:35 PM

## 2017-07-07 LAB — COMPREHENSIVE METABOLIC PANEL
ALT: 371 U/L — AB (ref 17–63)
AST: 423 U/L — AB (ref 15–41)
Albumin: 3.8 g/dL (ref 3.5–5.0)
Alkaline Phosphatase: 54 U/L (ref 38–126)
Anion gap: 5 (ref 5–15)
BUN: 10 mg/dL (ref 6–20)
CALCIUM: 9.4 mg/dL (ref 8.9–10.3)
CO2: 31 mmol/L (ref 22–32)
CREATININE: 1.03 mg/dL (ref 0.61–1.24)
Chloride: 108 mmol/L (ref 101–111)
GFR calc Af Amer: >60 mL/min (ref >60)
GLUCOSE: 89 mg/dL (ref 65–99)
Potassium: 4.3 mmol/L (ref 3.5–5.1)
Sodium: 144 mmol/L (ref 135–145)
TOTAL PROTEIN: 6.2 g/dL — AB (ref 6.5–8.1)
Total Bilirubin: 0.9 mg/dL (ref 0.3–1.2)

## 2017-07-07 LAB — CK: CK TOTAL: 11445 U/L — AB (ref 49–397)

## 2017-07-07 MED ORDER — CYCLOBENZAPRINE HCL 10 MG PO TABS: 10.0000 mg | ORAL_TABLET | Freq: Three times a day (TID) | ORAL | 0 refills | Status: DC | PRN

## 2017-07-07 MED ORDER — IBUPROFEN 600 MG PO TABS: 600.0000 mg | ORAL_TABLET | Freq: Four times a day (QID) | ORAL | 0 refills | Status: AC | PRN

## 2017-07-07 NOTE — Discharge Summary (Signed)
PATIENT DETAILS Name: Benjamin Mathews Age: 22 y.o. Sex: male Date of Birth: 1995/08/14 MRN: [id number]. Admitting Physician: Briscoe Deutscher, MD QMV:[id number], No Pcp Per  Admit Date: 07/01/2017 Discharge date: 07/07/2017  Recommendations for Outpatient Follow-up:  1. Follow up with PCP in 1-2 days-check CK and LFT's  Admitted From:  Home  Disposition: Home   Home Health: No  Equipment/Devices: None  Discharge Condition: Stable  CODE STATUS: FULL CODE  Diet recommendation:   Regular   Brief Summary: See H&P, Labs, Consult and Test reports for all details in brief, patient with no prior significant medical history-admitted with bilateral thigh pain after strenuous physical activity during gym workout-found to have rhabdomyolysis and admitted to the hospitalist service.   Brief Hospital Course: Rhabdomyolysis: Clearly provoked-and secondary to strenuous physical activity that patient participated in a local gym.  Before beginning exercising-he was fairly sedentary.  CK peaked to more than 50,000.  He was admitted in given IV fluids, CK has now started to downtrend-by day of discharge down to 11,000.  His urine initially was dark in color when he first came to the hospital-but now it is back to its usual color.  Since CKs downtrending-urine is back to normal color-patient has been advised that he continue to keep himself well-hydrated-and go to a primary care doctor of his choice in the next 1-2 days check his CK levels.  He claims that there is a urgent care right near where he lives-and he will go there either tomorrow or day after tomorrow to check his CK levels.  He has been asked to avoid excessive physical activity for the next few weeks-and when he begins physical activity-to try to slowly start by brisk walking/light running before proceeding to more strenuous physical activity.  He is agreeable with the above-noted plan-and will get a CK checked in the next few days.  He  is aware that if his thigh pain/muscle pains worsen or if his urine turns dark he needs to seek immediate medical attention.  Elevated liver enzymes: Downtrending-clearly secondary to rhabdomyolysis.  RUQ ultrasound did not show any abnormalities.  Acute hepatitis serology was negative.  Procedures/Studies: None  Discharge Diagnoses:  Active Problems:   Rhabdomyolysis   Discharge Instructions:  Activity:  As tolerated with Full fall precautions use walker/cane & assistance as needed   Discharge Instructions    Call MD for:   Complete by:  As directed    Severe muscle pain, or dark/cola colored urine   Diet general   Complete by:  As directed    Discharge instructions   Complete by:  As directed    Follow with Primary MD in the next 1-2 days to check a creatinine kinase level (muscle enzyme).  Continue to avoid strenuous physical activity for the next 2 weeks.  Please keep yourself well hydrated with fluids.  Please continue to watch the color of your urine-if it turns dark/cola colored-you need to return to the emergency room.  Please get a complete blood count and chemistry panel checked by your Primary MD at your next visit, and again as instructed by your Primary MD.  Get Medicines reviewed and adjusted: Please take all your medications with you for your next visit with your Primary MD  Laboratory/radiological data: Please request your Primary MD to go over all hospital tests and procedure/radiological results at the follow up, please ask your Primary MD to get all Hospital records sent to his/her office.  In some cases, they will  be blood work, cultures and biopsy results pending at the time of your discharge. Please request that your primary care M.D. follows up on these results.  Also Note the following: If you experience worsening of your admission symptoms, develop shortness of breath, life threatening emergency, suicidal or homicidal thoughts you must seek medical  attention immediately by calling 911 or calling your MD immediately  if symptoms less severe.  You must read complete instructions/literature along with all the possible adverse reactions/side effects for all the Medicines you take and that have been prescribed to you. Take any new Medicines after you have completely understood and accpet all the possible adverse reactions/side effects.   Do not drive when taking Pain medications or sleeping medications (Benzodaizepines)  Do not take more than prescribed Pain, Sleep and Anxiety Medications. It is not advisable to combine anxiety,sleep and pain medications without talking with your primary care practitioner  Special Instructions: If you have smoked or chewed Tobacco  in the last 2 yrs please stop smoking, stop any regular Alcohol  and or any Recreational drug use.  Wear Seat belts while driving.  Please note: You were cared for by a hospitalist during your hospital stay. Once you are discharged, your primary care physician will handle any further medical issues. Please note that NO REFILLS for any discharge medications will be authorized once you are discharged, as it is imperative that you return to your primary care physician (or establish a relationship with a primary care physician if you do not have one) for your post hospital discharge needs so that they can reassess your need for medications and monitor your lab values.   Increase activity slowly   Complete by:  As directed    Other Restrictions   Complete by:  As directed    Avoid strenuous physical activity/workout for at least 2 weeks.  Once your muscle pain has completely resolved-slowly start physical activity-by walking/brisk walking/light running before proceeding to weightlifting and other strenuous physical activity.     Allergies as of 07/07/2017   No Known Allergies     Medication List    STOP taking these medications   naproxen 500 MG tablet Commonly known as:   NAPROSYN   ondansetron 4 MG disintegrating tablet Commonly known as:  ZOFRAN-ODT     TAKE these medications   cyclobenzaprine 10 MG tablet Commonly known as:  FLEXERIL Take 1 tablet (10 mg total) by mouth 3 (three) times daily as needed for muscle spasms.   ibuprofen 600 MG tablet Commonly known as:  ADVIL,MOTRIN Take 1 tablet (600 mg total) by mouth every 6 (six) hours as needed for mild pain.       No Known Allergies   Consultations:   None   Other Procedures/Studies: US Abdomen Limited Ruq  Result Date: 07/06/2017 CLINICAL DATA:  22 year old male with abnormal LFTs. EXAM: ULTRASOUND ABDOMEN LIMITED RIGHT UPPER QUADRANT COMPARISON:  None. FINDINGS: Gallbladder: No gallstones or wall thickening visualized. No sonographic Murphy sign noted by sonographer. Common bile duct: Diameter: 2 millimeters, normal Liver: No intrahepatic biliary ductal dilatation is evident. No focal lesion identified. Within normal limits in parenchymal echogenicity. Portal vein is patent on color Doppler imaging with normal direction of blood flow towards the liver. Other findings: Negative visible right kidney.  No free fluid. IMPRESSION: Normal ultrasound appearance of the liver, gallbladder, and CBD. Electronically Signed   By: Odessa Fleming M.D.   On: 07/06/2017 14:29      TODAY-DAY OF DISCHARGE:  Subjective:   Benjamin Mathews today has no headache,no chest abdominal pain,no new weakness tingling or numbness, feels much better wants to go home today.   Objective:   Blood pressure 131/77, pulse 75, temperature 97.9 F (36.6 C), temperature source Oral, resp. rate 17, height  (1.651 m), weight 72.8 kg (160 lb 7.9 oz), SpO2 97 %.  Intake/Output Summary (Last 24 hours) at 07/07/2017 0912 Last data filed at 07/07/2017 0224 Gross per 24 hour  Intake 3485 ml  Output -  Net 3485 ml   Filed Weights   07/01/17 2346 07/02/17 0818  Weight: 70.3 kg (155 lb) 72.8 kg (160 lb 7.9 oz)    Exam: Awake  Alert, Oriented *3, No new F.N deficits, Normal affect [place].AT,PERRAL Supple Neck,No JVD, No cervical lymphadenopathy appriciated.  Symmetrical Chest wall movement, Good air movement bilaterally, CTAB RRR,No Gallops,Rubs or new Murmurs, No Parasternal Heave +ve B.Sounds, Abd Soft, Non tender, No organomegaly appriciated, No rebound -guarding or rigidity. No Cyanosis, Clubbing or edema, No new Rash or bruise   PERTINENT RADIOLOGIC STUDIES: US Abdomen Limited Ruq  Result Date: 07/06/2017 CLINICAL DATA:  22 year old male with abnormal LFTs. EXAM: ULTRASOUND ABDOMEN LIMITED RIGHT UPPER QUADRANT COMPARISON:  None. FINDINGS: Gallbladder: No gallstones or wall thickening visualized. No sonographic Murphy sign noted by sonographer. Common bile duct: Diameter: 2 millimeters, normal Liver: No intrahepatic biliary ductal dilatation is evident. No focal lesion identified. Within normal limits in parenchymal echogenicity. Portal vein is patent on color Doppler imaging with normal direction of blood flow towards the liver. Other findings: Negative visible right kidney.  No free fluid. IMPRESSION: Normal ultrasound appearance of the liver, gallbladder, and CBD. Electronically Signed   By: Odessa Fleming M.D.   On: 07/06/2017 14:29     PERTINENT LAB RESULTS: CBC: No results for input(s): WBC, HGB, HCT, PLT in the last 72 hours. CMET CMP     Component Value Date/Time   NA 144 07/07/2017 0324   K 4.3 07/07/2017 0324   CL 108 07/07/2017 0324   CO2 31 07/07/2017 0324   GLUCOSE 89 07/07/2017 0324   BUN 10 07/07/2017 0324   CREATININE 1.03 07/07/2017 0324   CALCIUM 9.4 07/07/2017 0324   PROT 6.2 (L) 07/07/2017 0324   ALBUMIN 3.8 07/07/2017 0324   AST 423 (H) 07/07/2017 0324   ALT 371 (H) 07/07/2017 0324   ALKPHOS 54 07/07/2017 0324   BILITOT 0.9 07/07/2017 0324   GFRNONAA >60 07/07/2017 0324   GFRAA >60 07/07/2017 0324    GFR Estimated Creatinine Clearance: 99.5 mL/min (by C-G formula based on SCr of 1.03  mg/dL). No results for input(s): LIPASE, AMYLASE in the last 72 hours. Recent Labs    07/05/17 0534 07/06/17 0505 07/07/17 0324  CKTOTAL 22,207* 16,109* 11,445*   Invalid input(s): POCBNP No results for input(s): DDIMER in the last 72 hours. No results for input(s): HGBA1C in the last 72 hours. No results for input(s): CHOL, HDL, LDLCALC, TRIG, CHOLHDL, LDLDIRECT in the last 72 hours. No results for input(s): TSH, T4TOTAL, T3FREE, THYROIDAB in the last 72 hours.  Invalid input(s): FREET3 No results for input(s): VITAMINB12, FOLATE, FERRITIN, TIBC, IRON, RETICCTPCT in the last 72 hours. Coags: Recent Labs    07/05/17 0534  INR 1.01   Microbiology: No results found for this or any previous visit (from the past 240 hour(s)).  FURTHER DISCHARGE INSTRUCTIONS:  Get Medicines reviewed and adjusted: Please take all your medications with you for your next visit with your Primary MD  Laboratory/radiological data: Please request your Primary MD to go over all hospital tests and procedure/radiological results at the follow up, please ask your Primary MD to get all Hospital records sent to his/her office.  In some cases, they will be blood work, cultures and biopsy results pending at the time of your discharge. Please request that your primary care M.D. goes through all the records of your hospital data and follows up on these results.  Also Note the following: If you experience worsening of your admission symptoms, develop shortness of breath, life threatening emergency, suicidal or homicidal thoughts you must seek medical attention immediately by calling 911 or calling your MD immediately  if symptoms less severe.  You must read complete instructions/literature along with all the possible adverse reactions/side effects for all the Medicines you take and that have been prescribed to you. Take any new Medicines after you have completely understood and accpet all the possible adverse  reactions/side effects.   Do not drive when taking Pain medications or sleeping medications (Benzodaizepines)  Do not take more than prescribed Pain, Sleep and Anxiety Medications. It is not advisable to combine anxiety,sleep and pain medications without talking with your primary care practitioner  Special Instructions: If you have smoked or chewed Tobacco  in the last 2 yrs please stop smoking, stop any regular Alcohol  and or any Recreational drug use.  Wear Seat belts while driving.  Please note: You were cared for by a hospitalist during your hospital stay. Once you are discharged, your primary care physician will handle any further medical issues. Please note that NO REFILLS for any discharge medications will be authorized once you are discharged, as it is imperative that you return to your primary care physician (or establish a relationship with a primary care physician if you do not have one) for your post hospital discharge needs so that they can reassess your need for medications and monitor your lab values.  Total Time spent coordinating discharge including counseling, education and face to face time equals 35 minutes.  SignedJeoffrey Massed 07/07/2017 9:12 AM

## 2017-07-07 NOTE — Progress Notes (Signed)
Discharge instructions reviewed with pt and his family. Pt to f/u with urgent care on 07/08/2017 for lab work.  Pt understands that he is to increase his water intake and education was provided that soft drinks and gator aide were not acceptable fluid intakes, that he needs to keep up with water intake to flush his system properly. Pt was instructed on activity level for the next two weeks.

## 2017-07-11 ENCOUNTER — Encounter: Payer: Self-pay | Admitting: Family Medicine

## 2017-07-11 ENCOUNTER — Ambulatory Visit: Payer: 59 | Admitting: Family Medicine

## 2017-07-11 VITALS — BP 104/80 | HR 78 | Ht 64.5 in | Wt 151.4 lb

## 2017-07-11 DIAGNOSIS — M6282 Rhabdomyolysis: Secondary | ICD-10-CM

## 2017-07-11 LAB — COMPREHENSIVE METABOLIC PANEL
ALBUMIN: 4.8 g/dL (ref 3.5–5.2)
ALK PHOS: 58 U/L (ref 39–117)
ALT: 178 U/L — ABNORMAL HIGH (ref 0–53)
AST: 60 U/L — ABNORMAL HIGH (ref 0–37)
BUN: 19 mg/dL (ref 6–23)
CALCIUM: 10 mg/dL (ref 8.4–10.5)
CHLORIDE: 101 meq/L (ref 96–112)
CO2: 31 mEq/L (ref 19–32)
CREATININE: 1.06 mg/dL (ref 0.40–1.50)
GFR: 94.62 mL/min (ref >60.00)
Glucose, Bld: 100 mg/dL — ABNORMAL HIGH (ref 70–99)
POTASSIUM: 4 meq/L (ref 3.5–5.1)
SODIUM: 141 meq/L (ref 135–145)
TOTAL PROTEIN: 7 g/dL (ref 6.0–8.3)
Total Bilirubin: 0.5 mg/dL (ref 0.2–1.2)

## 2017-07-11 LAB — CK: CK TOTAL: 716 U/L — AB (ref 7–232)

## 2017-07-11 NOTE — Assessment & Plan Note (Signed)
Seems to be doing well at this time, continue hydration Avoid strenuous activity until CK levels and LFT's normalize Recheck renal function, LFT's and CK today.

## 2017-07-11 NOTE — Patient Instructions (Signed)
Continue to push fluids and remain well hydrated Avoid strenuous activity until CK levels and liver functions are starting to normalize.    Rhabdomyolysis Rhabdomyolysis is a condition that happens when muscle cells break down and release substances into the blood that can damage the kidneys. This condition happens because of damage to the muscles that move bones (skeletal muscle). When the skeletal muscles are damaged, substances inside the muscle cells go into the blood. One of these substances is a protein called myoglobin. Large amounts of myoglobin can cause kidney damage or kidney failure. Other substances that are released by muscle cells may upset the balance of the minerals (electrolytes) in your blood. This imbalance causes your blood to have too much acid (acidosis). What are the causes? This condition is caused by muscle damage. Muscle damage often happens because of:  Using your muscles too much.  An injury that crushes or squeezes a muscle too tightly.  Using illegal drugs, mainly cocaine.  Alcohol abuse.  Other possible causes include:  Prescription medicines, such as those that: ? Lower cholesterol (statins). ? Treat ADHD (attention deficit hyperactivity disorder) or help with weight loss (amphetamines). ? Treat pain (opiates).  Infections.  Muscle diseases that are passed down from parent to child (inherited).  High fever.  Heatstroke.  Not having enough fluids in your body (dehydration).  Seizures.  Surgery.  What increases the risk? This condition is more likely to develop in people who:  Have a family history of muscle disease.  Take part in extreme sports, such as running in marathons.  Have diabetes.  Are older.  Abuse drugs or alcohol.  What are the signs or symptoms? Symptoms of this condition vary. Some people have very few symptoms, and other people have many symptoms. The most common symptoms include:  Muscle pain and swelling.  Weak  muscles.  Dark urine.  Feeling weak and tired.  Other symptoms include:  Nausea and vomiting.  Fever.  Pain in the abdomen.  Pain in the joints.  Symptoms of complications from this condition include:  Heart rhythm that is not normal (arrhythmia).  Seizures.  Not urinating enough because of kidney failure.  Very low blood pressure (shock). Signs of shock include dizziness, blurry vision, and clammy skin.  Bleeding that is hard to stop or control.  How is this diagnosed? This condition is diagnosed based on your medical history, your symptoms, and a physical exam. Tests may also be done, including:  Blood tests.  Urine tests to check for myoglobin.  You may also have other tests to check for causes of muscle damage and to check for complications. How is this treated? Treatment for this condition helps to:  Make sure you have enough fluids in your body.  Lower the acid levels in your blood to reverse acidosis.  Protect your kidneys.  Treatment may include:  Fluids and medicines given through an IV tube that is inserted into one of your veins.  Medicines to lower acidosis or to bring back the balance of the minerals in your body.  Hemodialysis. This treatment uses an artificial kidney machine to filter your blood while you recover. You may have this if other treatments are not helping.  Follow these instructions at home:  Take over-the-counter and prescription medicines only as told by your health care provider.  Rest at home until your health care provider says that you can return to your normal activities.  Drink enough fluid to keep your urine clear or pale yellow.  Do not do activities that take a lot of effort (are strenuous). Ask your health care provider what level of exercise is safe for you.  Do not abuse drugs or alcohol. If you are having problems with drug or alcohol use, ask your health care provider for help.  Keep all follow-up visits as  told by your health care provider. This is important. Contact a health care provider if:  You start having symptoms of this condition after treatment. Get help right away if:  You have a seizure.  You bleed easily or cannot control bleeding.  You cannot urinate.  You have chest pain.  You have trouble breathing. This information is not intended to replace advice given to you by your health care provider. Make sure you discuss any questions you have with your health care provider. Document Released: 01/26/2004 Document Revised: 11/25/2015 Document Reviewed: 11/25/2015 Elsevier Interactive Patient Education  [employer].

## 2017-07-11 NOTE — Progress Notes (Signed)
Benjamin Mathews - 22 y.o. male MRN [id number]  Date of birth: 1995-04-16  Subjective Chief Complaint  Patient presents with  . Hospitalization Follow-up    HPI Benjamin Mathews is a 22 y.o. male who up until recently has been in good health.  Unfortunately he was recently hospitalized for rhabdomyolysis.  This was precipitated by a strenuous leg workout a few days prior.  CK levels at time of presentation were >43,000 and he also had a transaminitis with normal renal function.  He was hydrated aggressively during hospitalization and CK levels and liver enzymes began trending down.  He had negative hepatitis serology and normal RUQ [place].  He tells me today that his pain has nearly resolved.  He continues to push fluids at home.  He denies changes to urination, ruq pain, fever, chills.   ROS: ROS completed and negative except as noted per HPI  No Known Allergies  Past Medical History:  Diagnosis Date  . Childhood asthma   . Concussion ~ 2013; ~ 2014  . Headache    "a few/year" (07/04/2017)  . Migraine    "a few/year" (07/04/2017)  . Non-traumatic rhabdomyolysis 07/01/2017    Past Surgical History:  Procedure Laterality Date  . NO PAST SURGERIES      Social History   Socioeconomic History  . Marital status: Single    Spouse name: Not on file  . Number of children: Not on file  . Years of education: Not on file  . Highest education level: Not on file  Occupational History  . Not on file  Social Needs  . Financial resource strain: Not on file  . Food insecurity:    Worry: Not on file    Inability: Not on file  . Transportation needs:    Medical: Not on file    Non-medical: Not on file  Tobacco Use  . Smoking status: Never Smoker  . Smokeless tobacco: Never Used  Substance and Sexual Activity  . Alcohol use: Never    Frequency: Never  . Drug use: Never  . Sexual activity: Yes  Lifestyle  . Physical activity:    Days per week: Not on file    Minutes per session: Not  on file  . Stress: Not on file  Relationships  . Social connections:    Talks on phone: Not on file    Gets together: Not on file    Attends religious service: Not on file    Active member of club or organization: Not on file    Attends meetings of clubs or organizations: Not on file    Relationship status: Not on file  Other Topics Concern  . Not on file  Social History Narrative  . Not on file    History reviewed. No pertinent family history.  Health Maintenance  Topic Date Due  . HIV Screening  06/22/2012  . TETANUS/TDAP  06/22/2016  . INFLUENZA VACCINE  09/26/2017    ----------------------------------------------------------------------------------------------------------------------------------------------------------------------------------------------------------------- Physical Exam BP 104/80 (BP Location: Right Arm, Patient Position: Sitting, Cuff Size: Normal)   Pulse 78   Ht 5' 4.5" (1.638 m)   Wt 151 lb 6.4 oz (68.7 kg)   BMI 25.59 kg/m   Physical Exam  Constitutional: He is oriented to person, place, and time. He appears well-nourished. No distress.  HENT:  Head: Normocephalic and atraumatic.  Mouth/Throat: Oropharynx is clear and moist.  Eyes: No scleral icterus.  Neck: Neck supple. No thyromegaly present.  Cardiovascular: Normal rate, regular rhythm and normal  heart sounds.  Pulmonary/Chest: Effort normal and breath sounds normal. No respiratory distress.  Musculoskeletal: He exhibits no tenderness (Muscles non-tender, no weakness in LE).  Neurological: He is alert and oriented to person, place, and time.  Psychiatric: He has a normal mood and affect. His behavior is normal.    ------------------------------------------------------------------------------------------------------------------------------------------------------------------------------------------------------------------- Assessment and Plan  Rhabdomyolysis Seems to be doing well at  this time, continue hydration Avoid strenuous activity until CK levels and LFT's normalize Recheck renal function, LFT's and CK today.

## 2017-07-12 NOTE — Progress Notes (Signed)
Numbers are continuing to improve and have almost normalized.  Continue to push fluids.

## 2017-08-05 ENCOUNTER — Encounter: Payer: Self-pay | Admitting: Family Medicine

## 2017-08-05 ENCOUNTER — Ambulatory Visit (INDEPENDENT_AMBULATORY_CARE_PROVIDER_SITE_OTHER): Payer: 59 | Admitting: Family Medicine

## 2017-08-05 VITALS — BP 108/72 | HR 94 | Temp 98.1°F | Ht 64.5 in | Wt 157.6 lb

## 2017-08-05 DIAGNOSIS — M6282 Rhabdomyolysis: Secondary | ICD-10-CM | POA: Diagnosis not present

## 2017-08-05 LAB — CK: Total CK: 107 U/L (ref 7–232)

## 2017-08-05 NOTE — Patient Instructions (Signed)
We'll call you with lab results If levels are normal, follow up as needed

## 2017-08-05 NOTE — Assessment & Plan Note (Signed)
CK levels have been decreasing appropriately Denies symptoms at this time. Repeat CK levels today to be sure these have returned to baseline Maintain adequate hydration and avoid severe overuse.

## 2017-08-05 NOTE — Progress Notes (Signed)
Benjamin Mathews - 22 y.o. male MRN [id number]  Date of birth: 1995/09/24  Subjective Chief Complaint  Patient presents with  . Follow-up    wants to make sure his levels are back down since hes back at work    HPI Benjamin Mathews is a 22 y.o. male here today for f/u of rhabdomyolysis.  He reports he is feeling well.  Last CK levels continued to decrease appropriately.  He is back at work and wants to be sure levels are staying down.  He denies any muscle pain or cramps and continues to maintain good hydration.   ROS: ROS completed and negative except as noted per HPI.   No Known Allergies  Past Medical History:  Diagnosis Date  . Childhood asthma   . Concussion ~ 2013; ~ 2014  . Headache    "a few/year" (07/04/2017)  . Migraine    "a few/year" (07/04/2017)  . Non-traumatic rhabdomyolysis 07/01/2017    Past Surgical History:  Procedure Laterality Date  . NO PAST SURGERIES      Social History   Socioeconomic History  . Marital status: Single    Spouse name: Not on file  . Number of children: Not on file  . Years of education: Not on file  . Highest education level: Not on file  Occupational History  . Not on file  Social Needs  . Financial resource strain: Not on file  . Food insecurity:    Worry: Not on file    Inability: Not on file  . Transportation needs:    Medical: Not on file    Non-medical: Not on file  Tobacco Use  . Smoking status: Never Smoker  . Smokeless tobacco: Never Used  Substance and Sexual Activity  . Alcohol use: Never    Frequency: Never  . Drug use: Never  . Sexual activity: Yes  Lifestyle  . Physical activity:    Days per week: Not on file    Minutes per session: Not on file  . Stress: Not on file  Relationships  . Social connections:    Talks on phone: Not on file    Gets together: Not on file    Attends religious service: Not on file    Active member of club or organization: Not on file    Attends meetings of clubs or  organizations: Not on file    Relationship status: Not on file  Other Topics Concern  . Not on file  Social History Narrative  . Not on file    Family History  Problem Relation Age of Onset  . Depression Mother   . High blood pressure Paternal Grandmother   . Hyperlipidemia Paternal Grandmother   . Hypertension Paternal Grandfather   . Hyperlipidemia Paternal Grandfather   . Stroke Paternal Grandfather     Health Maintenance  Topic Date Due  . [place]  06/22/2016  . INFLUENZA VACCINE  09/26/2017  . HIV Screening  Completed    ----------------------------------------------------------------------------------------------------------------------------------------------------------------------------------------------------------------- Physical Exam BP 108/72 (BP Location: Left Arm, Patient Position: Sitting, Cuff Size: Normal)   Pulse 94   Temp 98.1 F (36.7 C) (Oral)   Ht 5' 4.5" (1.638 m)   Wt 157 lb 9.6 oz (71.5 kg)   SpO2 97%   BMI 26.63 kg/m   Physical Exam  Constitutional: He is oriented to person, place, and time. He appears well-nourished. No distress.  HENT:  Head: Normocephalic and atraumatic.  Mouth/Throat: Oropharynx is clear and moist.  Eyes: No scleral  icterus.  Cardiovascular: Normal rate, regular rhythm and normal heart sounds.  Pulmonary/Chest: Effort normal and breath sounds normal.  Musculoskeletal: He exhibits no tenderness.  Neurological: He is alert and oriented to person, place, and time.  Psychiatric: He has a normal mood and affect. His behavior is normal.    ------------------------------------------------------------------------------------------------------------------------------------------------------------------------------------------------------------------- Assessment and Plan  Rhabdomyolysis CK levels have been decreasing appropriately Denies symptoms at this time. Repeat CK levels today to be sure these have returned to  baseline Maintain adequate hydration and avoid severe overuse.

## 2019-04-10 IMAGING — US US ABDOMEN LIMITED
1 series · 14 of 25 positions shown · non-contrast
Comparison: None.

CLINICAL DATA: 22-year-old male with abnormal LFTs.

EXAM:
ULTRASOUND ABDOMEN LIMITED RIGHT UPPER QUADRANT

[Series 1: us abdomen limited · 0.19mm/px · 14 of 36 slices shown]
[im 1/36]
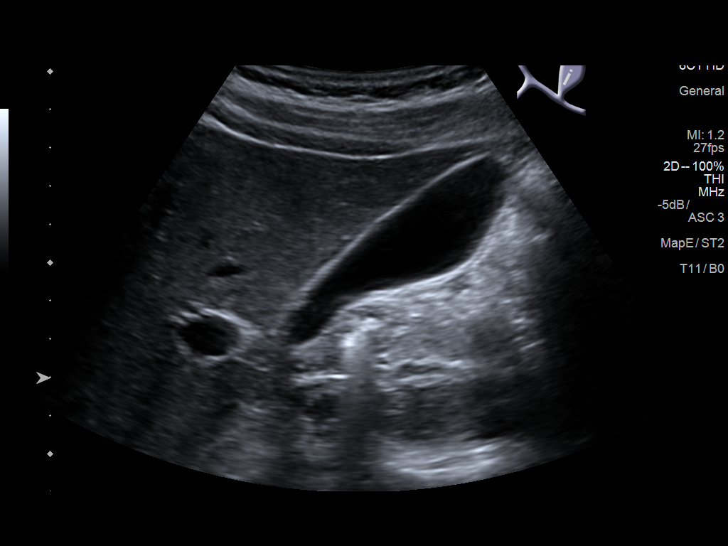
[im 3/36]
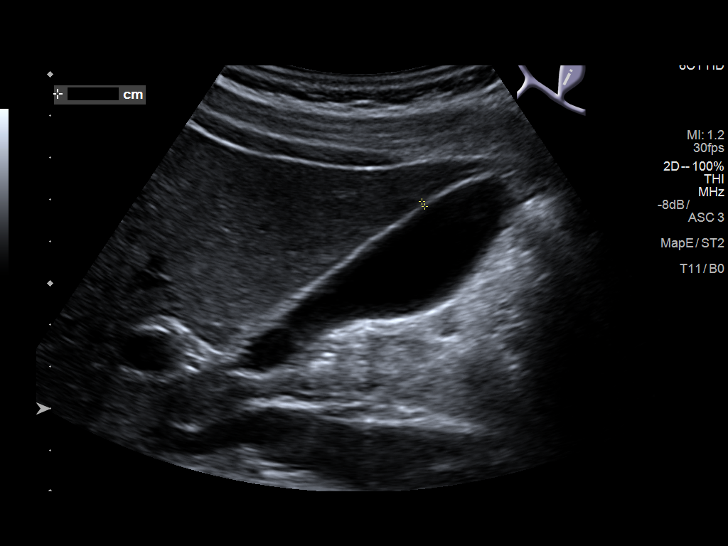
[im 6/36]
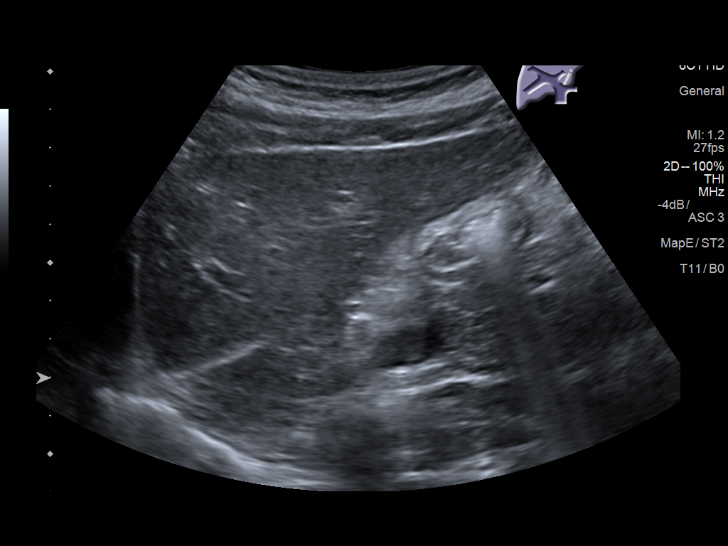
[im 9/36]
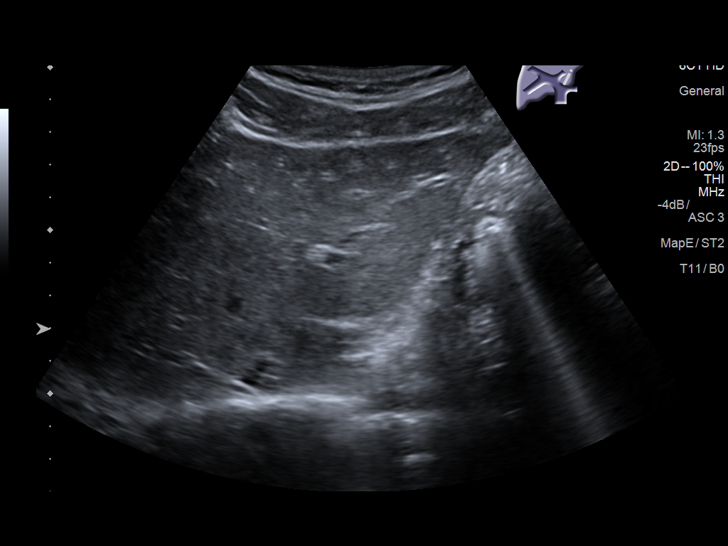
[im 12/36]
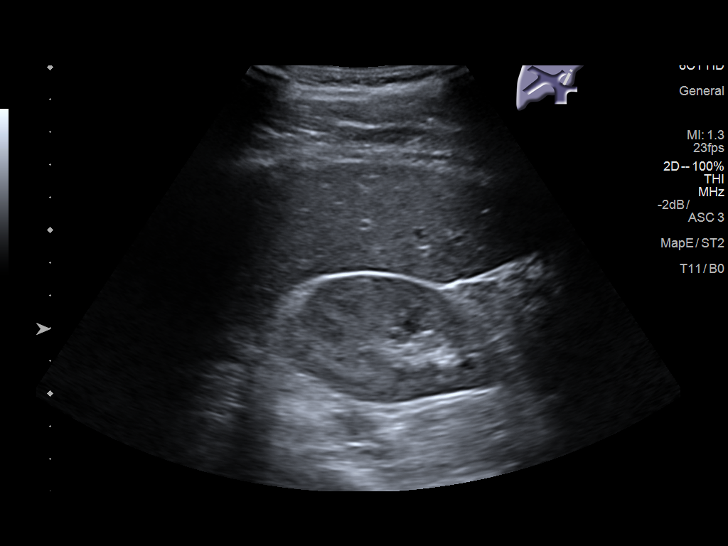
[im 14/36]
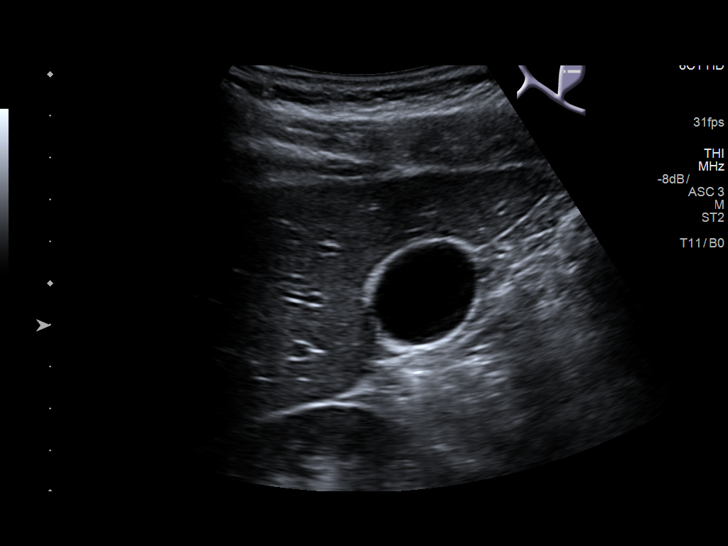
[im 17/36]
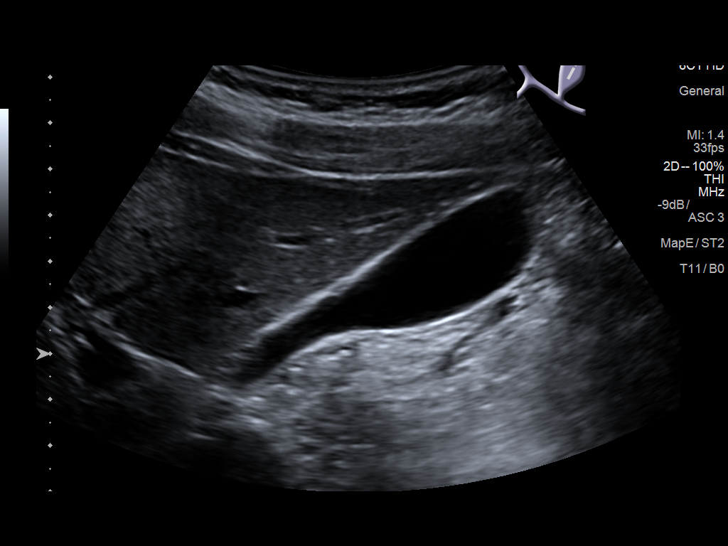
[im 19/36]
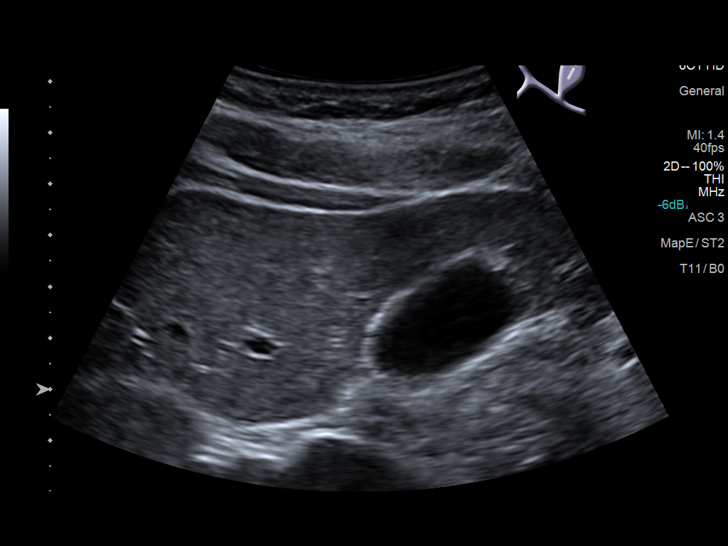
[im 22/36]
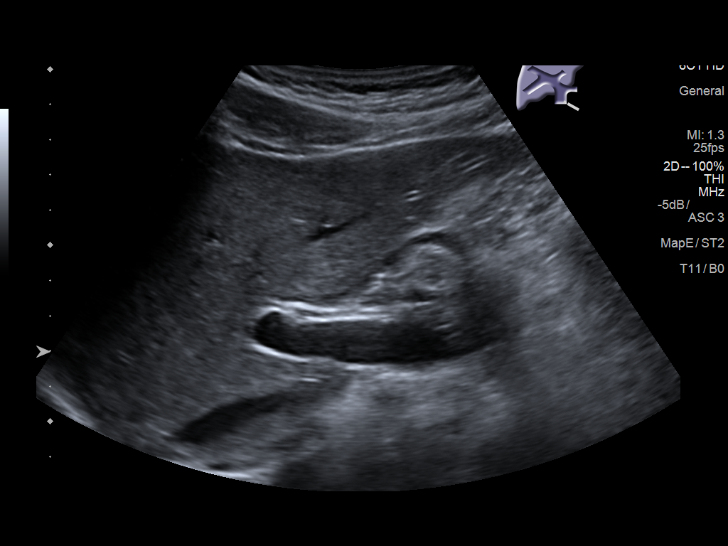
[im 24/36]
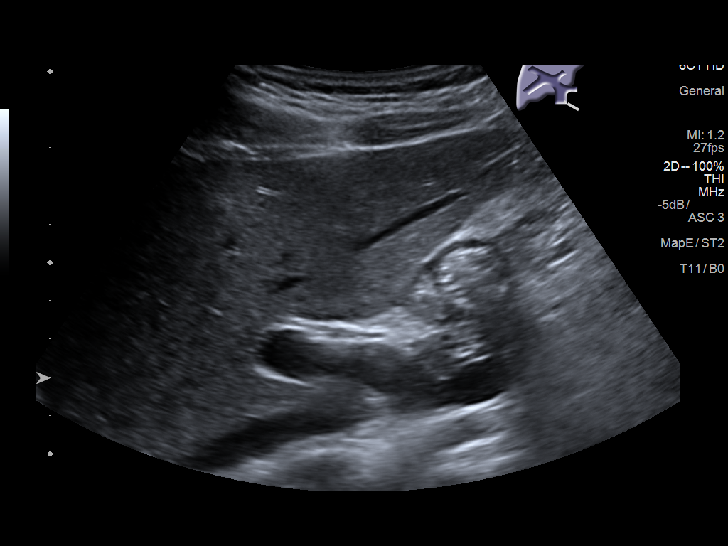
[im 27/36]
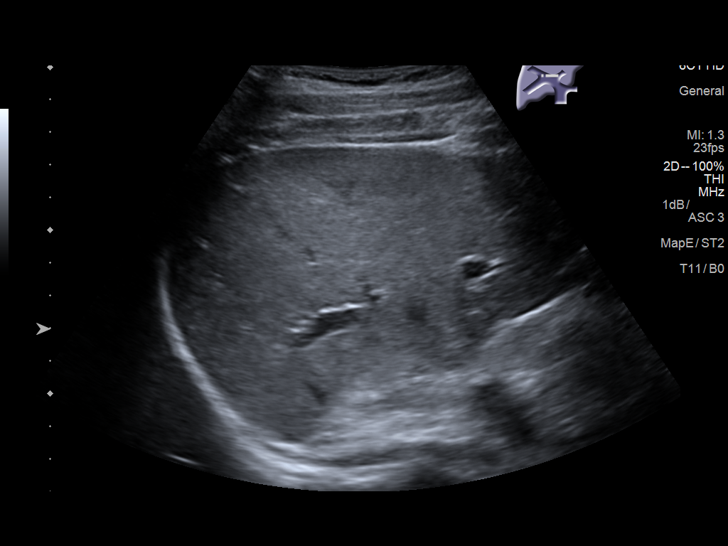
[im 30/36]
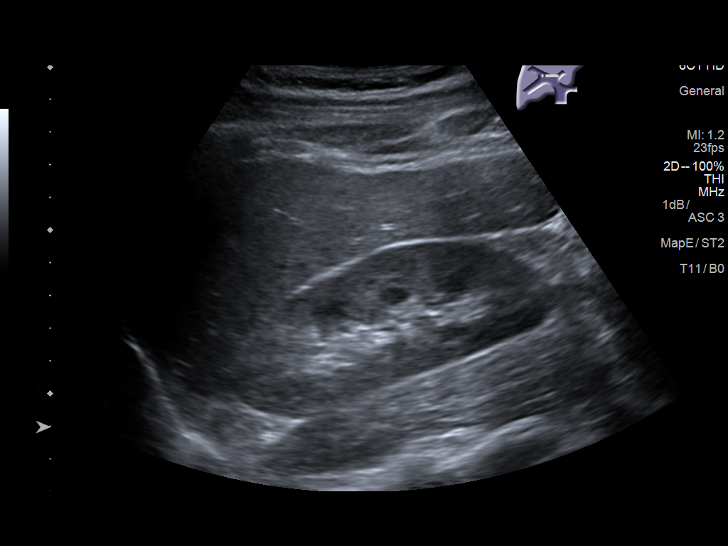
[im 33/36]
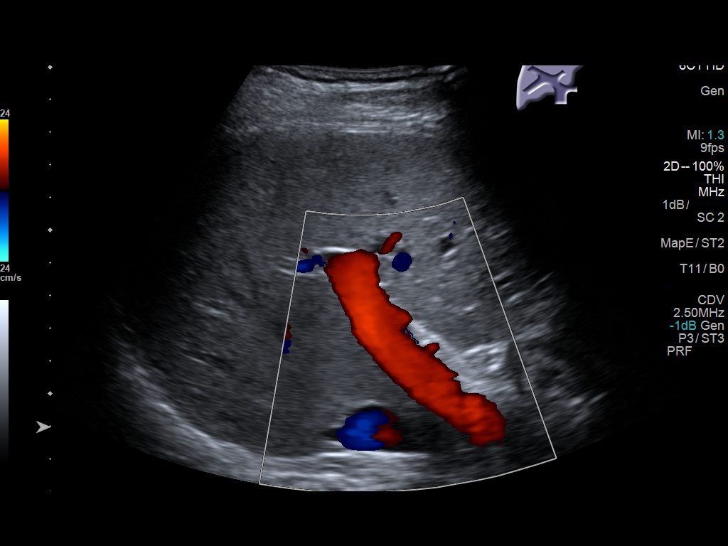
[im 36/36]
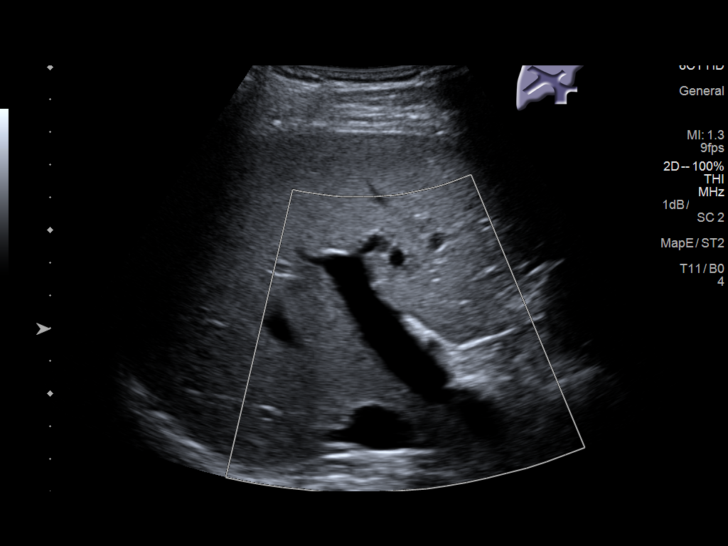

[14 of 25 positions shown; findings below may reference images not displayed]

FINDINGS: Gallbladder:

No gallstones or wall thickening visualized. No sonographic Murphy
sign noted by sonographer.

Common bile duct:

Diameter: 2 millimeters, normal

Liver:

No intrahepatic biliary ductal dilatation is evident. No focal
lesion identified. Within normal limits in parenchymal echogenicity.
Portal vein is patent on color Doppler imaging with normal direction
of blood flow towards the liver.

Other findings: Negative visible right kidney.  No free fluid.
IMPRESSION: Normal ultrasound appearance of the liver, gallbladder, and CBD.

## 2021-10-27 DIAGNOSIS — Z419 Encounter for procedure for purposes other than remedying health state, unspecified: Secondary | ICD-10-CM | POA: Diagnosis not present

## 2021-11-26 DIAGNOSIS — Z419 Encounter for procedure for purposes other than remedying health state, unspecified: Secondary | ICD-10-CM | POA: Diagnosis not present

## 2021-12-27 DIAGNOSIS — Z419 Encounter for procedure for purposes other than remedying health state, unspecified: Secondary | ICD-10-CM | POA: Diagnosis not present

## 2022-01-26 DIAGNOSIS — Z419 Encounter for procedure for purposes other than remedying health state, unspecified: Secondary | ICD-10-CM | POA: Diagnosis not present

## 2022-02-26 DIAGNOSIS — Z419 Encounter for procedure for purposes other than remedying health state, unspecified: Secondary | ICD-10-CM | POA: Diagnosis not present

## 2022-03-29 DIAGNOSIS — Z419 Encounter for procedure for purposes other than remedying health state, unspecified: Secondary | ICD-10-CM | POA: Diagnosis not present

## 2022-04-27 DIAGNOSIS — Z419 Encounter for procedure for purposes other than remedying health state, unspecified: Secondary | ICD-10-CM | POA: Diagnosis not present

## 2022-05-28 DIAGNOSIS — Z419 Encounter for procedure for purposes other than remedying health state, unspecified: Secondary | ICD-10-CM | POA: Diagnosis not present

## 2022-06-27 DIAGNOSIS — Z419 Encounter for procedure for purposes other than remedying health state, unspecified: Secondary | ICD-10-CM | POA: Diagnosis not present

## 2022-07-28 DIAGNOSIS — Z419 Encounter for procedure for purposes other than remedying health state, unspecified: Secondary | ICD-10-CM | POA: Diagnosis not present

## 2022-08-27 DIAGNOSIS — Z419 Encounter for procedure for purposes other than remedying health state, unspecified: Secondary | ICD-10-CM | POA: Diagnosis not present

## 2022-09-27 DIAGNOSIS — Z419 Encounter for procedure for purposes other than remedying health state, unspecified: Secondary | ICD-10-CM | POA: Diagnosis not present

## 2022-10-28 DIAGNOSIS — Z419 Encounter for procedure for purposes other than remedying health state, unspecified: Secondary | ICD-10-CM | POA: Diagnosis not present

## 2022-11-27 DIAGNOSIS — Z419 Encounter for procedure for purposes other than remedying health state, unspecified: Secondary | ICD-10-CM | POA: Diagnosis not present
